# Patient Record
Sex: Male | Born: 1970 | Race: Black or African American | Hispanic: No | Marital: Married | Smoking: Current every day smoker
Health system: Southern US, Community
[De-identification: ages and names within clinical notes are randomized; demographics above are authoritative.]

---

## 2016-10-09 ENCOUNTER — Emergency Department
Admission: EM | Admit: 2016-10-09 | Discharge: 2016-10-09 | Disposition: A | Discharge status: Home / Self Care | Payer: BLUE CROSS/BLUE SHIELD | Attending: Emergency Medicine | Admitting: Emergency Medicine

## 2016-10-09 ENCOUNTER — Emergency Department: Payer: BLUE CROSS/BLUE SHIELD

## 2016-10-09 ENCOUNTER — Encounter: Payer: Self-pay | Admitting: Emergency Medicine

## 2016-10-09 DIAGNOSIS — F17200 Nicotine dependence, unspecified, uncomplicated: Secondary | ICD-10-CM | POA: Diagnosis not present

## 2016-10-09 DIAGNOSIS — M7551 Bursitis of right shoulder: Secondary | ICD-10-CM | POA: Insufficient documentation

## 2016-10-09 DIAGNOSIS — M25511 Pain in right shoulder: Secondary | ICD-10-CM | POA: Diagnosis present

## 2016-10-09 MED ORDER — HYDROCODONE-ACETAMINOPHEN 5-325 MG PO TABS: 1.0000 | ORAL_TABLET | Freq: Four times a day (QID) | ORAL | 0 refills | Status: DC | PRN

## 2016-10-09 MED ORDER — IBUPROFEN 600 MG PO TABS: 600.0000 mg | ORAL_TABLET | Freq: Three times a day (TID) | ORAL | 0 refills | Status: AC

## 2016-10-09 MED ORDER — HYDROCODONE-ACETAMINOPHEN 5-325 MG PO TABS
2.0000 | ORAL_TABLET | Freq: Once | ORAL | Status: AC
  Administered 2016-10-09: 2 via ORAL
  Filled 2016-10-09: qty 2

## 2016-10-09 NOTE — ED Notes (Signed)
Patient reports chair he was sitting in broke and he fell, since then having right shoulder/upper arm pain.  No swelling or deformity noted, good right radial pulse, cap refill within normal limits.

## 2016-10-09 NOTE — ED Provider Notes (Signed)
[facility] Emergency Department Provider Note  ____________________________________________   First MD Initiated Contact with Patient 10/09/16 [phone]     (approximate)  I have reviewed the triage vital signs and the nursing notes.   HISTORY  Chief Complaint Shoulder Pain    HPI Duane Ford is a 46 y.o. male who comes to the emergency department with 2 days of moderate to severe aching throbbing discomfort throughout his right shoulder. He is right-hand dominant and works as a Psychologist, occupationalwelder. Pain all began suddenly when he sat on a chair his leg broke and he fell backwards onto his right shoulder. He denies numbness or weakness.   History reviewed. No pertinent past medical history.  There are no active problems to display for this patient.   History reviewed. No pertinent surgical history.  Prior to Admission medications   Medication Sig Start Date End Date Taking? Authorizing Provider  HYDROcodone-acetaminophen (NORCO) 5-325 MG tablet Take 1 tablet by mouth every 6 (six) hours as needed for severe pain. 10/09/16   Merrily Brittleifenbark, Arkie Tagliaferro, MD  ibuprofen (ADVIL,MOTRIN) 600 MG tablet Take 1 tablet (600 mg total) by mouth every 8 (eight) hours. 10/09/16 11/08/16  Merrily Brittleifenbark, Kollin Udell, MD    Allergies Patient has no known allergies.  No family history on file.  Social History Social History  Substance Use Topics  . Smoking status: Current Every Day Smoker  . Smokeless tobacco: Never Used  . Alcohol use Not on file    Review of Systems Constitutional: No fever/chills ENT: No sore throat. Cardiovascular: Denies chest pain. Respiratory: Denies shortness of breath. Gastrointestinal: No abdominal pain.  No nausea, no vomiting.  No diarrhea.  No constipation. Musculoskeletal: Negative for back pain. Neurological: Negative for headaches   ____________________________________________   PHYSICAL EXAM:  VITAL SIGNS: ED Triage Vitals  Enc Vitals Group     BP  10/09/16 0446 130/83     Pulse Rate 10/09/16 0446 (!) 55     Resp 10/09/16 0446 18     Temp 10/09/16 0446 98.4 F (36.9 C)     Temp Source 10/09/16 0446 Oral     SpO2 10/09/16 0446 99 %     Weight 10/09/16 0446 215 lb (97.5 kg)     Height 10/09/16 0446 5\' 6"  (1.676 m)     Head Circumference --      Peak Flow --      Pain Score 10/09/16 0445 8     Pain Loc --      Pain Edu? --      Excl. in GC? --     Constitutional: Alert and oriented 4 appears uncomfortable splinting his right side Head: Atraumatic. Nose: No congestion/rhinnorhea. Mouth/Throat: No trismus Neck: No stridor.   Cardiovascular: Regular rate and rhythm Respiratory: Normal respiratory effort.  No retractions. Musculoskeletal: No bony tenderness of his right shoulder although is quite tender diffusely over the lateral aspect. He is particularly tender just under the acromion process. He has exquisite discomfort when abducting his left shoulder and cannot get it past 90 neurovascularly intact Neurologic:  Normal speech and language. No gross focal neurologic deficits are appreciated.  Skin:  Skin is warm, dry and intact. No rash noted.    ____________________________________________  LABS (all labs ordered are listed, but only abnormal results are displayed)  Labs Reviewed - No data to display   __________________________________________  EKG   ____________________________________________  RADIOLOGY  X-ray of the right shoulder normal ____________________________________________   PROCEDURES  Procedure(s) performed: no  Procedures  Critical Care performed: no  Observation: no ____________________________________________   INITIAL IMPRESSION / ASSESSMENT AND PLAN / ED COURSE  Pertinent labs & imaging results that were available during my care of the patient were reviewed by me and considered in my medical decision making (see chart for details).  The patient has significant discomfort and  decreased range of motion of his right shoulder with no osseous abnormality. This is most consistent with bursitis versus a rotator cuff injury. We'll treat him symptomatically with high-dose NSAIDs and opioid pain medications for breakthrough and refer him to orthopedic surgery for further evaluation. He understands to continue codman's exercises to prevent frozen shoulder.  He is discharged home in improved condition.      ____________________________________________   FINAL CLINICAL IMPRESSION(S) / ED DIAGNOSES  Final diagnoses:  Bursitis of right shoulder      NEW MEDICATIONS STARTED DURING THIS VISIT:  Discharge Medication List as of 10/09/2016  6:19 AM    START taking these medications   Details  HYDROcodone-acetaminophen (NORCO) 5-325 MG tablet Take 1 tablet by mouth every 6 (six) hours as needed for severe pain., Starting Sun 10/09/2016, Print    ibuprofen (ADVIL,MOTRIN) 600 MG tablet Take 1 tablet (600 mg total) by mouth every 8 (eight) hours., Starting Sun 10/09/2016, Until Tue 11/08/2016, Print         Note:  This document was prepared using Dragon voice recognition software and may include unintentional dictation errors.      Merrily Brittleifenbark, Jiyah Torpey, MD 10/09/16 [phone]

## 2016-10-09 NOTE — Discharge Instructions (Signed)
Please take 600 mg of ibuprofen 3 times a day around the clock with food and use her Norco as needed for severe breakthrough pain. Please make an appointment to see the orthopedic surgeon in the next week or so for reevaluation. Return to the emergency department for any concerns.  It was a pleasure to take care of you today, and thank you for coming to our emergency department.  If you have any questions or concerns before leaving please ask the nurse to grab me and I'm more than happy to go through your aftercare instructions again.  If you were prescribed any opioid pain medication today such as Norco, Vicodin, Percocet, morphine, hydrocodone, or oxycodone please make sure you do not drive when you are taking this medication as it can alter your ability to drive safely.  If you have any concerns once you are home that you are not improving or are in fact getting worse before you can make it to your follow-up appointment, please do not hesitate to call [phone] and come back for further evaluation.  Merrily BrittleNeil Chikita Dogan, MD  No results found for this or any previous visit. Dg Shoulder Right  Result Date: 10/09/2016 CLINICAL DATA:  Chair leg broke and patient fell backwards onto his shoulder 2 days ago. Persistent pain. EXAM: RIGHT SHOULDER - 2+ VIEW COMPARISON:  None. FINDINGS: There is no evidence of fracture or dislocation. There is no evidence of arthropathy or other focal bone abnormality. Soft tissues are unremarkable. IMPRESSION: Negative. Electronically Signed   By: Ellery Plunkaniel R Mitchell M.D.   On: 10/09/2016 05:09

## 2016-10-09 NOTE — ED Triage Notes (Signed)
Patient states that he was sitting in a chair Friday night and it broke. Patient with complaint of right shoulder pain since.

## 2016-10-17 ENCOUNTER — Emergency Department
Admission: EM | Admit: 2016-10-17 | Discharge: 2016-10-17 | Disposition: A | Discharge status: Home / Self Care | Payer: BLUE CROSS/BLUE SHIELD | Attending: Emergency Medicine | Admitting: Emergency Medicine

## 2016-10-17 ENCOUNTER — Encounter: Payer: Self-pay | Admitting: Intensive Care

## 2016-10-17 DIAGNOSIS — M7581 Other shoulder lesions, right shoulder: Secondary | ICD-10-CM | POA: Diagnosis not present

## 2016-10-17 DIAGNOSIS — M778 Other enthesopathies, not elsewhere classified: Secondary | ICD-10-CM

## 2016-10-17 DIAGNOSIS — M7551 Bursitis of right shoulder: Secondary | ICD-10-CM

## 2016-10-17 DIAGNOSIS — F17210 Nicotine dependence, cigarettes, uncomplicated: Secondary | ICD-10-CM | POA: Diagnosis not present

## 2016-10-17 MED ORDER — CYCLOBENZAPRINE HCL 10 MG PO TABS
10.0000 mg | ORAL_TABLET | Freq: Once | ORAL | Status: AC
  Administered 2016-10-17: 10 mg via ORAL
  Filled 2016-10-17: qty 1

## 2016-10-17 MED ORDER — DICLOFENAC SODIUM 75 MG PO TBEC
75.0000 mg | DELAYED_RELEASE_TABLET | Freq: Once | ORAL | Status: AC
  Administered 2016-10-17: 75 mg via ORAL
  Filled 2016-10-17: qty 1

## 2016-10-17 MED ORDER — DICLOFENAC SODIUM 75 MG PO TBEC: 75.0000 mg | DELAYED_RELEASE_TABLET | Freq: Two times a day (BID) | ORAL | 0 refills | Status: AC

## 2016-10-17 MED ORDER — CYCLOBENZAPRINE HCL 5 MG PO TABS: 5.0000 mg | ORAL_TABLET | Freq: Three times a day (TID) | ORAL | 0 refills | Status: DC | PRN

## 2016-10-17 NOTE — ED Notes (Signed)

## 2016-10-17 NOTE — Discharge Instructions (Signed)
Take the prescription meds as directed. Apply ice and continue exercises. Follow-up with Dr. Rosita KeaMenz as scheduled.

## 2016-10-17 NOTE — ED Notes (Signed)
States right shoulder pain, hx of bursitis, states he was seen last week and given hydrocodine and motrin with no relief

## 2016-10-17 NOTE — ED Notes (Signed)
Medication (Voltaren) arrived from pharmacy.

## 2016-10-17 NOTE — ED Triage Notes (Signed)
Patient reports being diagnosed with bursitis in R shoulder last Sunday. Reports pain in shoulder has gotten worse. Patient has appointment the 13th with surgical.

## 2016-10-17 NOTE — ED Provider Notes (Signed)
[facility] Emergency Department Provider Note ____________________________________________  Time seen: 1814  I have reviewed the triage vital signs and the nursing notes.  HISTORY  Chief Complaint  Bursitis (r shoulder)  HPI Duane Ford is a 46 y.o. male turns to the ED for evaluation of continued right shoulder pain and disability. Patient describes being diagnosed with bursitis about 4 days prior after mechanical fall on outstretched arm backwards. He was discharged with prescription for ibuprofen and hydrocodone. He reports now that he has really dosed hydrocodone and reports loose stools with the ibuprofen. He denies any reinjury in the interim. He is scheduled to see Dr. Rosita KeaMenz on 8/13, for follow-up.He denies any distal paresthesias.   History reviewed. No pertinent past medical history.  There are no active problems to display for this patient.  History reviewed. No pertinent surgical history.  Prior to Admission medications   Medication Sig Start Date End Date Taking? Authorizing Provider  cyclobenzaprine (FLEXERIL) 5 MG tablet Take 1 tablet (5 mg total) by mouth 3 (three) times daily as needed for muscle spasms. 10/17/16   Shadie Sweatman, Charlesetta IvoryJenise V Bacon, PA-C  diclofenac (VOLTAREN) 75 MG EC tablet Take 1 tablet (75 mg total) by mouth 2 (two) times daily. 10/17/16 11/01/16  Raeshaun Simson, Charlesetta IvoryJenise V Bacon, PA-C  HYDROcodone-acetaminophen (NORCO) 5-325 MG tablet Take 1 tablet by mouth every 6 (six) hours as needed for severe pain. 10/09/16   Merrily Brittleifenbark, Neil, MD  ibuprofen (ADVIL,MOTRIN) 600 MG tablet Take 1 tablet (600 mg total) by mouth every 8 (eight) hours. 10/09/16 11/08/16  Merrily Brittleifenbark, Neil, MD   Allergies Patient has no known allergies.  History reviewed. No pertinent family history.  Social History Social History  Substance Use Topics  . Smoking status: Current Every Day Smoker    Packs/day: 0.50    Types: Cigarettes  . Smokeless tobacco: Never Used  . Alcohol  use 6.0 oz/week    10 Cans of beer per week   Review of Systems  Constitutional: Negative for fever. Cardiovascular: Negative for chest pain. Respiratory: Negative for shortness of breath. Musculoskeletal: Negative for back pain. Right shoulder pain as above. Skin: Negative for rash. Neurological: Negative for headaches, focal weakness or numbness. ____________________________________________  PHYSICAL EXAM:  VITAL SIGNS: ED Triage Vitals  Enc Vitals Group     BP 10/17/16 1724 129/79     Pulse Rate 10/17/16 1724 62     Resp 10/17/16 1724 16     Temp 10/17/16 1724 98.5 F (36.9 C)     Temp Source 10/17/16 1724 Oral     SpO2 10/17/16 1724 98 %     Weight 10/17/16 1726 215 lb (97.5 kg)     Height 10/17/16 1726 5\' 6"  (1.676 m)     Head Circumference --      Peak Flow --      Pain Score 10/17/16 1724 10     Pain Loc --      Pain Edu? --      Excl. in GC? --     Constitutional: Alert and oriented. Well appearing and in no distress. Head: Normocephalic and atraumatic. Cardiovascular: Normal rate, regular rhythm. Normal distal pulses. Respiratory: Normal respiratory effort. No wheezes/rales/rhonchi. Musculoskeletal: Right shoulder without dislocation, deformity, or sulcus sign. He is tender to palp over the posterior infraspinatus musculature and deltoid. Negative drop arm and empty can test. Normal internal/external rotation. Decreased abduction ROM. Normal composite fist. Nontender with normal range of motion in all other extremities.  Neurologic:  Normal gross sensation. Normal speech and language. No gross focal neurologic deficits are appreciated. Skin:  Skin is warm, dry and intact. No rash noted. ____________________________________________  PROCEDURES  Diclofenac 75 mg PO Flexeril 10 mg PO ____________________________________________  INITIAL IMPRESSION / ASSESSMENT AND PLAN / ED COURSE  Patient with the ED for reevaluation of right shoulder bursitis and strain.  Patient's exam is consistent with a probable bursitis with tendinitis. He will be discharged with a prescription for cyclobenzaprine and Voltaren to dose as directed. He will follow up with orthopedics on Monday as scheduled. He is also encouraged to continue with ice therapy and range of motion exercises previously prescribed. Work is provided for 2 days as requested. ____________________________________________  FINAL CLINICAL IMPRESSION(S) / ED DIAGNOSES  Final diagnoses:  Shoulder tendinitis, right  Bursitis of right shoulder      Karmen Stabs, Charlesetta Ivory, PA-C 10/17/16 1848    Jeanmarie Plant, MD 10/17/16 2042

## 2018-10-18 ENCOUNTER — Emergency Department: Payer: Self-pay

## 2018-10-18 ENCOUNTER — Emergency Department
Admission: EM | Admit: 2018-10-18 | Discharge: 2018-10-18 | Disposition: A | Discharge status: Home / Self Care | Payer: Self-pay | Attending: Emergency Medicine | Admitting: Emergency Medicine

## 2018-10-18 ENCOUNTER — Encounter: Payer: Self-pay | Admitting: Emergency Medicine

## 2018-10-18 ENCOUNTER — Other Ambulatory Visit: Payer: Self-pay

## 2018-10-18 DIAGNOSIS — S2241XA Multiple fractures of ribs, right side, initial encounter for closed fracture: Secondary | ICD-10-CM

## 2018-10-18 DIAGNOSIS — Y999 Unspecified external cause status: Secondary | ICD-10-CM | POA: Insufficient documentation

## 2018-10-18 DIAGNOSIS — Y9389 Activity, other specified: Secondary | ICD-10-CM | POA: Insufficient documentation

## 2018-10-18 DIAGNOSIS — S27321A Contusion of lung, unilateral, initial encounter: Secondary | ICD-10-CM

## 2018-10-18 DIAGNOSIS — F17210 Nicotine dependence, cigarettes, uncomplicated: Secondary | ICD-10-CM | POA: Insufficient documentation

## 2018-10-18 DIAGNOSIS — Y929 Unspecified place or not applicable: Secondary | ICD-10-CM | POA: Insufficient documentation

## 2018-10-18 DIAGNOSIS — M25511 Pain in right shoulder: Secondary | ICD-10-CM | POA: Insufficient documentation

## 2018-10-18 MED ORDER — HYDROCODONE-ACETAMINOPHEN 7.5-325 MG PO TABS
1.0000 | ORAL_TABLET | Freq: Once | ORAL | Status: AC
  Administered 2018-10-18: 1 via ORAL
  Filled 2018-10-18: qty 1

## 2018-10-18 MED ORDER — OXYCODONE-ACETAMINOPHEN 7.5-325 MG PO TABS: 1.0000 | ORAL_TABLET | Freq: Four times a day (QID) | ORAL | 0 refills | Status: AC | PRN
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
## 2018-10-18 NOTE — Discharge Instructions (Addendum)
Follow-up with 1 the clinics listed on your discharge papers.  You will need to call make an appointment and most likely fill out some papers.  [facility] of charge.  Return to the emergency department if any severe worsening of your symptoms.  Pain medication has been sent to your pharmacy.  Do not drive or operate machinery while taking this medication as it could cause drowsiness.  You may also use ice to the area as needed for discomfort.  They can take 2 to 4 weeks for ribs to heal and for you to start getting relief of your pain.
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
